# Patient Record
Sex: Male | Born: 1975 | Hispanic: No | Marital: Single | Smoking: Never smoker
Health system: Southern US, Community
[De-identification: ages and names within clinical notes are randomized; demographics above are authoritative.]

## PROBLEM LIST (undated history)

## (undated) DIAGNOSIS — Z789 Other specified health status: Secondary | ICD-10-CM

## (undated) HISTORY — DX: Other specified health status: Z78.9

## (undated) HISTORY — PX: NO PAST SURGERIES: SHX2092

---

## 2018-12-02 ENCOUNTER — Encounter: Payer: Self-pay | Admitting: Sports Medicine

## 2018-12-02 ENCOUNTER — Other Ambulatory Visit: Payer: Self-pay

## 2018-12-02 ENCOUNTER — Ambulatory Visit (INDEPENDENT_AMBULATORY_CARE_PROVIDER_SITE_OTHER): Payer: Managed Care, Other (non HMO)

## 2018-12-02 ENCOUNTER — Ambulatory Visit (INDEPENDENT_AMBULATORY_CARE_PROVIDER_SITE_OTHER): Payer: Managed Care, Other (non HMO) | Admitting: Sports Medicine

## 2018-12-02 DIAGNOSIS — R911 Solitary pulmonary nodule: Secondary | ICD-10-CM

## 2018-12-02 DIAGNOSIS — S20212A Contusion of left front wall of thorax, initial encounter: Secondary | ICD-10-CM

## 2018-12-02 DIAGNOSIS — S52515A Nondisplaced fracture of left radial styloid process, initial encounter for closed fracture: Secondary | ICD-10-CM

## 2018-12-02 DIAGNOSIS — S298XXA Other specified injuries of thorax, initial encounter: Secondary | ICD-10-CM | POA: Insufficient documentation

## 2018-12-02 NOTE — Assessment & Plan Note (Signed)
11 mm pulmonary nodule that simply appeared to be granulomatous changes. He plans to establish care with Dr. Luetta Nutting or one of our other PCPs and I would like them to follow this up.

## 2018-12-02 NOTE — Assessment & Plan Note (Signed)
Radial styloid, continue thumb spica brace, he declines cast placement. No pain medication needed, because he has some pain at the snuffbox we are going to proceed with a CT scan. Pain at the snuffbox, known radial styloid, history of scaphoid fracture, need further evaluation as to whether there is an acute scaphoid fracture
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
## 2018-12-02 NOTE — Assessment & Plan Note (Signed)
No fracture seen on CT scan. Symptomatic treatment alone.

## 2018-12-04 ENCOUNTER — Ambulatory Visit
Admission: RE | Admit: 2018-12-04 | Discharge: 2018-12-04 | Disposition: A | Discharge status: Home / Self Care | Payer: Managed Care, Other (non HMO) | Source: Ambulatory Visit | Attending: Sports Medicine | Admitting: Sports Medicine

## 2018-12-04 ENCOUNTER — Other Ambulatory Visit: Payer: Self-pay

## 2018-12-30 ENCOUNTER — Ambulatory Visit (INDEPENDENT_AMBULATORY_CARE_PROVIDER_SITE_OTHER): Payer: Managed Care, Other (non HMO) | Admitting: Sports Medicine

## 2018-12-30 ENCOUNTER — Other Ambulatory Visit: Payer: Self-pay

## 2018-12-30 ENCOUNTER — Encounter: Payer: Self-pay | Admitting: Sports Medicine

## 2018-12-30 ENCOUNTER — Ambulatory Visit (INDEPENDENT_AMBULATORY_CARE_PROVIDER_SITE_OTHER): Payer: Managed Care, Other (non HMO)

## 2018-12-30 DIAGNOSIS — S52515A Nondisplaced fracture of left radial styloid process, initial encounter for closed fracture: Secondary | ICD-10-CM | POA: Diagnosis not present

## 2018-12-30 NOTE — Assessment & Plan Note (Signed)
Nontender over the fracture, expected loss of range of motion with immobilization. Repeat x-rays today, we will continue his brace for another 2 weeks and then work on some physical therapy and range of motion.

## 2018-12-30 NOTE — Progress Notes (Signed)
Subjective:    CC: Follow-up  HPI: Radial styloid fracture: Continues to improve.  He does have some significant discomfort at his MCP of the thumb.  I reviewed the past medical history, family history, social history, surgical history, and allergies today and no changes were needed.  Please see the problem list section below in epic for further details.  Past Medical History: Past Medical History:  Diagnosis Date  . No pertinent past medical history    Past Surgical History: Past Surgical History:  Procedure Laterality Date  . NO PAST SURGERIES     Social History: Social History   Socioeconomic History  . Marital status: Single    Spouse name: Not on file  . Number of children: Not on file  . Years of education: Not on file  . Highest education level: Not on file  Occupational History  . Not on file  Social Needs  . Financial resource strain: Not on file  . Food insecurity    Worry: Not on file    Inability: Not on file  . Transportation needs    Medical: Not on file    Non-medical: Not on file  Tobacco Use  . Smoking status: Never Smoker  . Smokeless tobacco: Never Used  Substance and Sexual Activity  . Alcohol use: Not Currently  . Drug use: Never  . Sexual activity: Yes  Lifestyle  . Physical activity    Days per week: Not on file    Minutes per session: Not on file  . Stress: Not on file  Relationships  . Social Herbalist on phone: Not on file    Gets together: Not on file    Attends religious service: Not on file    Active member of club or organization: Not on file    Attends meetings of clubs or organizations: Not on file    Relationship status: Not on file  Other Topics Concern  . Not on file  Social History Narrative  . Not on file   Family History: No family history on file. Allergies: No Known Allergies Medications: See med rec.  Review of Systems: No fevers, chills, night sweats, weight loss, chest pain, or shortness of  breath.   Objective:    General: Well Developed, well nourished, and in no acute distress.  Neuro: Alert and oriented x3, extra-ocular muscles intact, sensation grossly intact.  HEENT: Normocephalic, atraumatic, pupils equal round reactive to light, neck supple, no masses, no lymphadenopathy, thyroid nonpalpable.  Skin: Warm and dry, no rashes. Cardiac: Regular rate and rhythm, no murmurs rubs or gallops, no lower extremity edema.  Respiratory: Clear to auscultation bilaterally. Not using accessory muscles, speaking in full sentences. Left wrist: Inspection normal with no visible erythema or swelling. ROM smooth and normal with good flexion and extension and ulnar/radial deviation that is symmetrical with opposite wrist. Palpation is normal over metacarpals, navicular, lunate, and TFCC; tendons without tenderness/ swelling No snuffbox tenderness. No tenderness over Canal of Guyon. Strength 5/5 in all directions without pain. Negative tinel's and phalens signs. Negative Finkelstein sign. Negative Watson's test. There is minimal tenderness over the left first MCP with loss of motion.  Impression and Recommendations:    Nondisplaced fracture of left radial styloid process, initial encounter for closed fracture Nontender over the fracture, expected loss of range of motion with immobilization. Repeat x-rays today, we will continue his brace for another 2 weeks and then work on some physical therapy and range of motion.  ___________________________________________ Gwen Her. Dianah Field, M.D., ABFM., CAQSM. Primary Care and Sports Medicine [facility]  Adjunct Professor of [place] [employer]

## 2019-01-06 ENCOUNTER — Encounter: Payer: Self-pay | Admitting: Osteopathic Medicine

## 2019-01-06 ENCOUNTER — Other Ambulatory Visit: Payer: Self-pay

## 2019-01-06 ENCOUNTER — Ambulatory Visit (INDEPENDENT_AMBULATORY_CARE_PROVIDER_SITE_OTHER): Payer: Managed Care, Other (non HMO) | Admitting: Osteopathic Medicine

## 2019-01-06 VITALS — BP 127/82 | HR 55 | Temp 97.6°F | Wt 210.0 lb

## 2019-01-06 DIAGNOSIS — Z0000 Encounter for general adult medical examination without abnormal findings: Secondary | ICD-10-CM

## 2019-01-06 DIAGNOSIS — Z833 Family history of diabetes mellitus: Secondary | ICD-10-CM | POA: Diagnosis not present

## 2019-01-06 DIAGNOSIS — Z8639 Personal history of other endocrine, nutritional and metabolic disease: Secondary | ICD-10-CM | POA: Diagnosis not present

## 2019-01-06 DIAGNOSIS — R911 Solitary pulmonary nodule: Secondary | ICD-10-CM | POA: Diagnosis not present

## 2019-01-06 DIAGNOSIS — Z111 Encounter for screening for respiratory tuberculosis: Secondary | ICD-10-CM

## 2019-01-06 NOTE — Progress Notes (Signed)
HPI: Craig Haas is a 43 y.o. male who  has a past medical history of No pertinent past medical history.  he presents to [facility], 01/06/[facility],  for chief complaint of: Annual check-up    Patient here for annual physical / wellness exam.  See preventive care reviewed as below.  Recent imaging results reviewed in detail with the patient.   Additional concerns today include:  None Trying to lose weight and get back in shape Seeing Dr T for fracture care   Past medical, surgical, social and family history reviewed:  Patient Active Problem List   Diagnosis Date Noted  . Nondisplaced fracture of left radial styloid process, initial encounter for closed fracture 12/02/2018  . Rib contusion, left, initial encounter 12/02/2018  . Pulmonary nodule 12/02/2018    Past Surgical History:  Procedure Laterality Date  . NO PAST SURGERIES      Social History   Tobacco Use  . Smoking status: Never Smoker  . Smokeless tobacco: Never Used  Substance Use Topics  . Alcohol use: Not Currently    No family history on file.   Current medication list and allergy/intolerance information reviewed:    No current outpatient medications on file.   No current facility-administered medications for this visit.    No Known Allergies    Review of Systems:  Constitutional:  No  fever, no chills, No recent illness, No unintentional weight changes. No significant fatigue.   HEENT: No  headache, no vision change, no hearing change, No sore throat, No  sinus pressure  Cardiac: No  chest pain, No  pressure, No palpitations, No  Orthopnea  Respiratory:  No  shortness of breath. No  Cough  Gastrointestinal: No  abdominal pain, No  nausea, No  vomiting,  No  blood in stool, No  diarrhea, No  constipation   Musculoskeletal: No new myalgia/arthralgia  Skin: No  Rash, No other wounds/concerning lesions  Endocrine: No cold intolerance,  No heat  intolerance. No polyuria/polydipsia/polyphagia   Neurologic: No  weakness, No  dizziness  Psychiatric: No  concerns with depression, No  concerns with anxiety, No sleep problems, No mood problems  Exam:  BP 127/82 (BP Location: Right Arm, Patient Position: Sitting, Cuff Size: Normal)   Pulse (!) 55   Temp 97.6 F (36.4 C) (Oral)   Wt 210 lb (95.3 kg)   BMI 31.01 kg/m   Constitutional: VS see above. General Appearance: alert, well-developed, well-nourished, NAD  Eyes: Normal lids and conjunctive, non-icteric sclera  Ears, Nose, Mouth, Throat: TM normal bilaterally.  Neck: No masses, trachea midline. No thyroid enlargement. No tenderness/mass appreciated. No lymphadenopathy  Respiratory: Normal respiratory effort. no wheeze, no rhonchi, no rales  Cardiovascular: S1/S2 normal, no murmur, no rub/gallop auscultated. RRR. No lower extremity edema.   Gastrointestinal: Nontender, no masses. No hepatomegaly, no splenomegaly. No hernia appreciated. Bowel sounds normal. Rectal exam deferred.   Musculoskeletal: Gait normal. No clubbing/cyanosis of digits.   Neurological: Normal balance/coordination. No tremor. No cranial nerve deficit on limited exam. Motor and sensation intact and symmetric. Cerebellar reflexes intact.   Skin: warm, dry, intact. No rash/ulcer. No concerning nevi or subq nodules on limited exam.    Psychiatric: Normal judgment/insight. Normal mood and affect. Oriented x3.    No results found for this or any previous visit (from the past 72 hour(s)).  No results found.   ASSESSMENT/PLAN: The primary encounter diagnosis was Annual physical exam. Diagnoses of Pulmonary nodule, History of high  cholesterol, Family history of diabetes mellitus, and Screening-pulmonary TB were also pertinent to this visit.  Patient was advised to bring disc with his imaging on it, would probably send this to one of our radiologists for a second opinion regarding follow-up, no follow-up was  mentioned in initial report.  Patient is at low risk for TB, does not work in a healthcare setting, no travel to developing countries, no night sweats/weight loss, no cough.  Personal reminder set in epic to circle back on this in 2 weeks  Immunization History  Administered Date(s) Administered  . Tdap 06/24/2014    Orders Placed This Encounter  Procedures  . CBC  . COMPLETE METABOLIC PANEL WITH GFR  . LIPID SCREENING  . TSH  . QuantiFERON-TB Gold Plus       Patient Instructions  General Preventive Care  Most recent routine screening lipids/other labs: ordered today   Everyone should have blood pressure checked once per year.   Tobacco: don't!   Alcohol: responsible moderation is ok for most adults - if you have concerns about your alcohol intake, please talk to me!   Exercise: as tolerated to reduce risk of cardiovascular disease and diabetes. Strength training will also prevent osteoporosis.   Mental health: if need for mental health care (medicines, counseling, other), or concerns about moods, please let me know!   Sexual health: if need for STD testing, or if concerns with libido/pain problems, please let me know!   Advanced Directive: Living Will and/or Healthcare Power of Attorney recommended for all adults, regardless of age or health.  Vaccines  Flu vaccine: recommended for almost everyone, every fall.   Shingles vaccine: Shingrix recommended after age 48.   Pneumonia vaccines: Prevnar and Pneumovax recommended after age 74.  Tetanus booster: Tdap recommended every 10 years. Due 2026.  Cancer screenings   Colon cancer screening: recommended for everyone at age 63-50  Prostate cancer screening: PSA blood test around age 59  Lung cancer screening: not needed for non-smokers Infection screenings . HIV: recommended screening at least once age 84-65, more often as needed. . Gonorrhea/Chlamydia: screening as needed . Hepatitis C: recommended for anyone born  08-1961 . TB: certain at-risk populations, or depending on work requirements and/or travel history - might consider, given your CT results Other . Bone Density Test: recommended for men at age 3, sooner depending on risk factors . Abdominal Aortic Aneurysm: screening with ultrasound recommended once for men age 41-75 who have ever smoked        Visit summary with medication list and pertinent instructions was printed for patient to review. All questions at time of visit were answered - patient instructed to contact office with any additional concerns or updates. ER/RTC precautions were reviewed with the patient.      Please note: voice recognition software was used to produce this document, and typos may escape review. Please contact Dr. Lyn Hollingshead for any needed clarifications.     Follow-up plan: Return in about 1 year (around 01/06/2020) for [place] (call week prior to visit for lab orders).

## 2019-01-06 NOTE — Patient Instructions (Addendum)
General Preventive Care  Most recent routine screening lipids/other labs: ordered today   Everyone should have blood pressure checked once per year.   Tobacco: don't!   Alcohol: responsible moderation is ok for most adults - if you have concerns about your alcohol intake, please talk to me!   Exercise: as tolerated to reduce risk of cardiovascular disease and diabetes. Strength training will also prevent osteoporosis.   Mental health: if need for mental health care (medicines, counseling, other), or concerns about moods, please let me know!   Sexual health: if need for STD testing, or if concerns with libido/pain problems, please let me know!   Advanced Directive: Living Will and/or Healthcare Power of Attorney recommended for all adults, regardless of age or health.  Vaccines  Flu vaccine: recommended for almost everyone, every fall.   Shingles vaccine: Shingrix recommended after age 38.   Pneumonia vaccines: Prevnar and Pneumovax recommended after age 67.  Tetanus booster: Tdap recommended every 10 years. Due 2026.  Cancer screenings   Colon cancer screening: recommended for everyone at age 1-50  Prostate cancer screening: PSA blood test around age 24  Lung cancer screening: not needed for non-smokers Infection screenings . HIV: recommended screening at least once age 21-65, more often as needed. . Gonorrhea/Chlamydia: screening as needed . Hepatitis C: recommended for anyone born 65-1965 . TB: certain at-risk populations, or depending on work requirements and/or travel history - might consider, given your CT results Other . Bone Density Test: recommended for men at age 77, sooner depending on risk factors . Abdominal Aortic Aneurysm: screening with ultrasound recommended once for men age 65-75 who have ever smoked

## 2019-01-07 ENCOUNTER — Encounter: Payer: Self-pay | Admitting: Osteopathic Medicine

## 2019-01-09 LAB — LIPID PANEL
Cholesterol: 195 mg/dL (ref <200)
HDL: 33 mg/dL — ABNORMAL LOW (ref >=40)
LDL Cholesterol (Calc): 138 mg/dL (calc) — ABNORMAL HIGH
Non-HDL Cholesterol (Calc): 162 mg/dL (calc) — ABNORMAL HIGH (ref <130)
Total CHOL/HDL Ratio: 5.9 (calc) — ABNORMAL HIGH (ref <5.0)
Triglycerides: 121 mg/dL (ref <150)

## 2019-01-09 LAB — COMPLETE METABOLIC PANEL WITH GFR
AG Ratio: 1.5 (calc) (ref 1.0–2.5)
ALT: 52 U/L — ABNORMAL HIGH (ref 9–46)
AST: 27 U/L (ref 10–40)
Albumin: 4.4 g/dL (ref 3.6–5.1)
Alkaline phosphatase (APISO): 81 U/L (ref 36–130)
BUN: 19 mg/dL (ref 7–25)
CO2: 28 mmol/L (ref 20–32)
Calcium: 9.3 mg/dL (ref 8.6–10.3)
Chloride: 106 mmol/L (ref 98–110)
Creat: 1.02 mg/dL (ref 0.60–1.35)
GFR, Est African American: 105 mL/min/{1.73_m2} (ref >=60)
GFR, Est Non African American: 91 mL/min/{1.73_m2} (ref >=60)
Globulin: 2.9 g/dL (calc) (ref 1.9–3.7)
Glucose, Bld: 105 mg/dL — ABNORMAL HIGH (ref 65–99)
Potassium: 4.5 mmol/L (ref 3.5–5.3)
Sodium: 141 mmol/L (ref 135–146)
Total Bilirubin: 1 mg/dL (ref 0.2–1.2)
Total Protein: 7.3 g/dL (ref 6.1–8.1)

## 2019-01-09 LAB — CBC
HCT: 43.6 % (ref 38.5–50.0)
Hemoglobin: 15 g/dL (ref 13.2–17.1)
MCH: 31.9 pg (ref 27.0–33.0)
MCHC: 34.4 g/dL (ref 32.0–36.0)
MCV: 92.8 fL (ref 80.0–100.0)
MPV: 11.7 fL (ref 7.5–12.5)
Platelets: 260 10*3/uL (ref 140–400)
RBC: 4.7 10*6/uL (ref 4.20–5.80)
RDW: 12.7 % (ref 11.0–15.0)
WBC: 5.5 10*3/uL (ref 3.8–10.8)

## 2019-01-09 LAB — TSH: TSH: 1.49 mIU/L (ref 0.40–4.50)

## 2019-01-09 LAB — HEMOGLOBIN A1C W/OUT EAG: Hgb A1c MFr Bld: 5.2 % of total Hgb (ref <5.7)

## 2019-01-09 LAB — QUANTIFERON-TB GOLD PLUS
Mitogen-NIL: >10 IU/mL
NIL: 0.03 IU/mL
QuantiFERON-TB Gold Plus: NEGATIVE
TB1-NIL: 0 IU/mL
TB2-NIL: 0.02 IU/mL

## 2019-01-22 ENCOUNTER — Telehealth: Payer: Self-pay | Admitting: Osteopathic Medicine

## 2019-01-22 ENCOUNTER — Encounter: Payer: Self-pay | Admitting: Osteopathic Medicine

## 2019-01-22 NOTE — Telephone Encounter (Addendum)
See MyChart message

## 2019-01-27 ENCOUNTER — Ambulatory Visit (INDEPENDENT_AMBULATORY_CARE_PROVIDER_SITE_OTHER): Payer: Managed Care, Other (non HMO)

## 2019-01-27 ENCOUNTER — Encounter: Payer: Self-pay | Admitting: Sports Medicine

## 2019-01-27 ENCOUNTER — Ambulatory Visit (INDEPENDENT_AMBULATORY_CARE_PROVIDER_SITE_OTHER): Payer: Managed Care, Other (non HMO) | Admitting: Sports Medicine

## 2019-01-27 ENCOUNTER — Other Ambulatory Visit: Payer: Self-pay

## 2019-01-27 DIAGNOSIS — M79642 Pain in left hand: Secondary | ICD-10-CM | POA: Diagnosis not present

## 2019-01-27 DIAGNOSIS — S52515A Nondisplaced fracture of left radial styloid process, initial encounter for closed fracture: Secondary | ICD-10-CM

## 2019-01-27 NOTE — Assessment & Plan Note (Addendum)
Undiagnosed new pain in the left hand, over the fifth metacarpal as well as the ulnar-sided carpals. Reproduction of pain with squeezing the metacarpals together. Unclear etiology, x-rays today, we do need to focus on the pisiform and the triquetrum. We did do a CT scan of his wrist at the initial injury that did not show any injury and his fifth metacarpal or carpals. We will watch this for an additional 2 weeks and if persistent discomfort we will proceed with MRI of the hand and wrist.
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
## 2019-02-10 ENCOUNTER — Other Ambulatory Visit: Payer: Self-pay

## 2019-02-10 ENCOUNTER — Ambulatory Visit (INDEPENDENT_AMBULATORY_CARE_PROVIDER_SITE_OTHER): Payer: Managed Care, Other (non HMO) | Admitting: Sports Medicine

## 2019-02-10 DIAGNOSIS — S52515A Nondisplaced fracture of left radial styloid process, initial encounter for closed fracture: Secondary | ICD-10-CM | POA: Diagnosis not present
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
## 2020-01-12 ENCOUNTER — Encounter: Payer: Managed Care, Other (non HMO) | Admitting: Osteopathic Medicine

## 2020-01-12 ENCOUNTER — Telehealth: Payer: Self-pay | Admitting: General Practice
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
## 2020-01-12 NOTE — Telephone Encounter (Signed)
OK to remove from the schedule

## 2020-02-17 ENCOUNTER — Ambulatory Visit (INDEPENDENT_AMBULATORY_CARE_PROVIDER_SITE_OTHER): Payer: 59 | Admitting: Osteopathic Medicine

## 2020-02-17 ENCOUNTER — Encounter: Payer: Self-pay | Admitting: Osteopathic Medicine

## 2020-02-17 ENCOUNTER — Other Ambulatory Visit: Payer: Self-pay

## 2020-02-17 VITALS — BP 119/85 | HR 59 | Wt 209.1 lb

## 2020-02-17 DIAGNOSIS — Z833 Family history of diabetes mellitus: Secondary | ICD-10-CM | POA: Diagnosis not present

## 2020-02-17 DIAGNOSIS — Z0000 Encounter for general adult medical examination without abnormal findings: Secondary | ICD-10-CM | POA: Diagnosis not present

## 2020-02-17 DIAGNOSIS — Z8639 Personal history of other endocrine, nutritional and metabolic disease: Secondary | ICD-10-CM

## 2020-02-17 DIAGNOSIS — M778 Other enthesopathies, not elsewhere classified: Secondary | ICD-10-CM

## 2020-02-17 DIAGNOSIS — R7989 Other specified abnormal findings of blood chemistry: Secondary | ICD-10-CM

## 2020-02-17 NOTE — Patient Instructions (Addendum)
General Preventive Care  Most recent routine screening lipids/other labs: ordered today   Everyone should have blood pressure checked once per year.   Tobacco: don't!   Alcohol: responsible moderation is ok for most adults - if you have concerns about your alcohol intake, please talk to me!   Exercise: as tolerated to reduce risk of cardiovascular disease and diabetes. Strength training will also prevent osteoporosis.   Mental health: if need for mental health care (medicines, counseling, other), or concerns about moods, please let me know!   Sexual health: if need for STD testing, or if concerns with libido/pain problems, please let me know!   Advanced Directive: Living Will and/or Healthcare Power of Attorney recommended for all adults, regardless of age or health.  Vaccines  Flu vaccine: recommended every fall.   Shingles vaccine: recommended after age 73.   Pneumonia vaccines: recommended after age 65.  Tetanus booster: Tdap recommended every 10 years. Due 2026.   COVID vaccine: STRONGLY RECOMMENDED  Cancer screenings   Colon cancer screening: recommended for everyone at age 58-75  Prostate cancer screening: PSA blood test age 41  Lung cancer screening: not needed for non-smokers Infection screenings  HIV: recommended screening at least once age 65-65  Gonorrhea/Chlamydia: screening as needed  Hepatitis C: recommended for anyone born 70-1963  TB: certain at-risk populations, or depending on work requirements and/or travel history - might consider, given your CT results Other  Bone Density Test: recommended for men at age 51                Please note: Preventive care issues were addressed today as part of your annual wellness physical, and this care should be covered under your insurance. However there were other medical issues which were also addressed today, and insurance may bill you separately for a "problem-based visit" for this care: shoulder  pain. Any questions or concerns about charges which may appear on your billing statement should be directed to your insurance company or to [facility] billing department, and they will contact our office if there are further concerns.
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
## 2020-02-20 ENCOUNTER — Encounter: Payer: Self-pay | Admitting: Osteopathic Medicine

## 2020-02-20 ENCOUNTER — Telehealth: Payer: Self-pay

## 2020-02-20 DIAGNOSIS — Z0000 Encounter for general adult medical examination without abnormal findings: Secondary | ICD-10-CM

## 2020-02-20 DIAGNOSIS — Z113 Encounter for screening for infections with a predominantly sexual mode of transmission: Secondary | ICD-10-CM

## 2020-02-20 NOTE — Telephone Encounter (Signed)
Pt requested to have HIV testing added to previous lab order on 02/18/20. Task completed. Req #: [id number].

## 2020-02-23 ENCOUNTER — Ambulatory Visit
Admission: RE | Admit: 2020-02-23 | Discharge: 2020-02-23 | Disposition: A | Discharge status: Home / Self Care | Payer: 59 | Source: Ambulatory Visit | Attending: Osteopathic Medicine | Admitting: Osteopathic Medicine

## 2020-02-23 ENCOUNTER — Other Ambulatory Visit: Payer: Self-pay

## 2020-02-23 DIAGNOSIS — M778 Other enthesopathies, not elsewhere classified: Secondary | ICD-10-CM

## 2020-02-23 LAB — HEMOGLOBIN A1C
Hgb A1c MFr Bld: 5.5 % of total Hgb (ref <5.7)
Mean Plasma Glucose: 111 mg/dL
eAG (mmol/L): 6.2 mmol/L

## 2020-02-23 LAB — COMPLETE METABOLIC PANEL WITH GFR
AG Ratio: 1.6 (calc) (ref 1.0–2.5)
ALT: 30 U/L (ref 9–46)
AST: 18 U/L (ref 10–40)
Albumin: 4.4 g/dL (ref 3.6–5.1)
Alkaline phosphatase (APISO): 87 U/L (ref 36–130)
BUN: 17 mg/dL (ref 7–25)
CO2: 31 mmol/L (ref 20–32)
Calcium: 9.2 mg/dL (ref 8.6–10.3)
Chloride: 106 mmol/L (ref 98–110)
Creat: 1.13 mg/dL (ref 0.60–1.35)
GFR, Est African American: 92 mL/min/{1.73_m2} (ref >=60)
GFR, Est Non African American: 80 mL/min/{1.73_m2} (ref >=60)
Globulin: 2.7 g/dL (calc) (ref 1.9–3.7)
Glucose, Bld: 101 mg/dL — ABNORMAL HIGH (ref 65–99)
Potassium: 4.5 mmol/L (ref 3.5–5.3)
Sodium: 142 mmol/L (ref 135–146)
Total Bilirubin: 0.8 mg/dL (ref 0.2–1.2)
Total Protein: 7.1 g/dL (ref 6.1–8.1)

## 2020-02-23 LAB — CBC
HCT: 41.5 % (ref 38.5–50.0)
Hemoglobin: 14.5 g/dL (ref 13.2–17.1)
MCH: 32 pg (ref 27.0–33.0)
MCHC: 34.9 g/dL (ref 32.0–36.0)
MCV: 91.6 fL (ref 80.0–100.0)
MPV: 11.9 fL (ref 7.5–12.5)
Platelets: 276 10*3/uL (ref 140–400)
RBC: 4.53 10*6/uL (ref 4.20–5.80)
RDW: 12.5 % (ref 11.0–15.0)
WBC: 6.9 10*3/uL (ref 3.8–10.8)

## 2020-02-23 LAB — LIPID PANEL
Cholesterol: 193 mg/dL (ref <200)
HDL: 30 mg/dL — ABNORMAL LOW (ref >=40)
LDL Cholesterol (Calc): 136 mg/dL (calc) — ABNORMAL HIGH
Non-HDL Cholesterol (Calc): 163 mg/dL (calc) — ABNORMAL HIGH (ref <130)
Total CHOL/HDL Ratio: 6.4 (calc) — ABNORMAL HIGH (ref <5.0)
Triglycerides: 141 mg/dL (ref <150)

## 2020-02-23 LAB — TESTOSTERONE: Testosterone: 327 ng/dL (ref 250–827)

## 2020-02-23 LAB — HIV ANTIBODY (ROUTINE TESTING W REFLEX)

## 2020-02-23 LAB — TSH: TSH: 2.47 mIU/L (ref 0.40–4.50)

## 2020-02-24 NOTE — Addendum Note (Signed)
Addended by: Deirdre Pippins on: 02/24/2020 05:10 PM   Modules accepted: Orders

## 2020-03-01 LAB — HIV ANTIBODY (ROUTINE TESTING W REFLEX): HIV 1&2 Ab, 4th Generation: NONREACTIVE

## 2020-12-21 ENCOUNTER — Other Ambulatory Visit: Payer: Self-pay | Admitting: Orthopedic Surgery

## 2020-12-21 DIAGNOSIS — M25511 Pain in right shoulder: Secondary | ICD-10-CM

## 2020-12-21 DIAGNOSIS — M25521 Pain in right elbow: Secondary | ICD-10-CM
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
## 2021-06-16 ENCOUNTER — Ambulatory Visit (INDEPENDENT_AMBULATORY_CARE_PROVIDER_SITE_OTHER): Payer: Managed Care, Other (non HMO) | Admitting: Family Medicine

## 2021-06-16 ENCOUNTER — Encounter: Payer: Self-pay | Admitting: Family Medicine

## 2021-06-16 VITALS — BP 120/68 | HR 68 | Ht 69.0 in | Wt 183.0 lb

## 2021-06-16 DIAGNOSIS — Z13220 Encounter for screening for lipoid disorders: Secondary | ICD-10-CM

## 2021-06-16 DIAGNOSIS — Z0000 Encounter for general adult medical examination without abnormal findings: Secondary | ICD-10-CM | POA: Diagnosis not present

## 2021-06-16 DIAGNOSIS — R5383 Other fatigue: Secondary | ICD-10-CM | POA: Diagnosis not present

## 2021-06-16 NOTE — Assessment & Plan Note (Signed)
Well adult ?Orders Placed This Encounter  ?Procedures  ?? COMPLETE METABOLIC PANEL WITH GFR  ?? CBC with Differential  ?? Lipid Panel w/reflex Direct LDL  ?? TSH  ?? Testosterone  ?Screenings: Per lab orders ?Immunizations: Up-to-date ?Anticipatory guidance/risk factor reduction: Recommendations per AVS. ?

## 2021-06-16 NOTE — Progress Notes (Signed)
Craig Haas - 46 y.o. male MRN [id number]  Date of birth: 06/28/1975  Subjective Chief Complaint  Patient presents with   Annual Exam    HPI Craig Haas is a 46 y.o. male here today for annual exam.  He reports that he is doing fairly well at this time.  He has had some fatigue and doesn't feel like he heals as quickly as he once did.  He is concerned that testosterone levels may be low.    He does try to continue to stay pretty active.  Has recently had setback due to shoulder surgery.  Does feel like his diet is pretty good.  He is a non-smoker.  Denies alcohol use at this time.  Review of Systems  Constitutional:  Negative for chills, fever, malaise/fatigue and weight loss.  HENT:  Negative for congestion, ear pain and sore throat.   Eyes:  Negative for blurred vision, double vision and pain.  Respiratory:  Negative for cough and shortness of breath.   Cardiovascular:  Negative for chest pain and palpitations.  Gastrointestinal:  Negative for abdominal pain, blood in stool, constipation, heartburn and nausea.  Genitourinary:  Negative for dysuria and urgency.  Musculoskeletal:  Negative for joint pain and myalgias.  Neurological:  Negative for dizziness and headaches.  Endo/Heme/Allergies:  Does not bruise/bleed easily.  Psychiatric/Behavioral:  Negative for depression. The patient is not nervous/anxious and does not have insomnia.      No Known Allergies  Past Medical History:  Diagnosis Date   No pertinent past medical history     Past Surgical History:  Procedure Laterality Date   NO PAST SURGERIES      Social History   Socioeconomic History   Marital status: Single    Spouse name: Not on file   Number of children: Not on file   Years of education: Not on file   Highest education level: Not on file  Occupational History   Not on file  Tobacco Use   Smoking status: Never   Smokeless tobacco: Never  Substance and Sexual Activity   Alcohol use: Not  Currently   Drug use: Never   Sexual activity: Yes  Other Topics Concern   Not on file  Social History Narrative   Not on file   Social Determinants of Health   Financial Resource Strain: Not on file  Food Insecurity: Not on file  Transportation Needs: Not on file  Physical Activity: Not on file  Stress: Not on file  Social Connections: Not on file    History reviewed. No pertinent family history.  Health Maintenance  Topic Date Due   COVID-19 Vaccine (1) 09/23/2021 (Originally 05/22/1976)   Hepatitis C Screening  06/17/2022 (Originally 11/21/1995)   INFLUENZA VACCINE  08/23/2021   TETANUS/TDAP  06/23/2024   HIV Screening  Completed   HPV VACCINES  Aged Out     ----------------------------------------------------------------------------------------------------------------------------------------------------------------------------------------------------------------- Physical Exam BP 120/68 (BP Location: Left Arm, Patient Position: Sitting, Cuff Size: Normal)   Pulse 68   Ht 5\' 9"  (1.753 m)   Wt 183 lb (83 kg)   SpO2 99%   BMI 27.02 kg/m   Physical Exam Constitutional:      General: He is not in acute distress. HENT:     Head: Normocephalic and atraumatic.     Right Ear: Tympanic membrane and external ear normal.     Left Ear: Tympanic membrane and external ear normal.  Eyes:     General: No scleral icterus. Neck:  Thyroid: No thyromegaly.  Cardiovascular:     Rate and Rhythm: Normal rate and regular rhythm.     Heart sounds: Normal heart sounds.  Pulmonary:     Effort: Pulmonary effort is normal.     Breath sounds: Normal breath sounds.  Abdominal:     General: Bowel sounds are normal. There is no distension.     Palpations: Abdomen is soft.     Tenderness: There is no abdominal tenderness. There is no guarding.  Musculoskeletal:     Cervical back: Normal range of motion.  Lymphadenopathy:     Cervical: No cervical adenopathy.  Skin:    General:  Skin is warm and dry.     Findings: No rash.  Neurological:     Mental Status: He is alert and oriented to person, place, and time.     Cranial Nerves: No cranial nerve deficit.     Motor: No abnormal muscle tone.  Psychiatric:        Mood and Affect: Mood normal.        Behavior: Behavior normal.    ------------------------------------------------------------------------------------------------------------------------------------------------------------------------------------------------------------------- Assessment and Plan  Well adult exam Well adult Orders Placed This Encounter  Procedures   COMPLETE METABOLIC PANEL WITH GFR   CBC with Differential   Lipid Panel w/reflex Direct LDL   TSH   Testosterone  Screenings: Per lab orders Immunizations: Up-to-date Anticipatory guidance/risk factor reduction: Recommendations per AVS.   No orders of the defined types were placed in this encounter.   No follow-ups on file.    This visit occurred during the SARS-CoV-2 public health emergency.  Safety protocols were in place, including screening questions prior to the visit, additional usage of staff PPE, and extensive cleaning of exam room while observing appropriate contact time as indicated for disinfecting solutions.

## 2021-06-16 NOTE — Patient Instructions (Signed)

## 2021-06-17 LAB — LIPID PANEL W/REFLEX DIRECT LDL
Cholesterol: 173 mg/dL (ref <200)
HDL: 39 mg/dL — ABNORMAL LOW (ref >=40)
LDL Cholesterol (Calc): 113 mg/dL (calc) — ABNORMAL HIGH
Non-HDL Cholesterol (Calc): 134 mg/dL (calc) — ABNORMAL HIGH (ref <130)
Total CHOL/HDL Ratio: 4.4 (calc) (ref <5.0)
Triglycerides: 103 mg/dL (ref <150)

## 2021-06-17 LAB — TSH: TSH: 1.58 mIU/L (ref 0.40–4.50)

## 2021-06-17 LAB — COMPLETE METABOLIC PANEL WITH GFR
AG Ratio: 1.7 (calc) (ref 1.0–2.5)
ALT: 26 U/L (ref 9–46)
AST: 25 U/L (ref 10–40)
Albumin: 4.3 g/dL (ref 3.6–5.1)
Alkaline phosphatase (APISO): 84 U/L (ref 36–130)
BUN: 15 mg/dL (ref 7–25)
CO2: 27 mmol/L (ref 20–32)
Calcium: 9.3 mg/dL (ref 8.6–10.3)
Chloride: 104 mmol/L (ref 98–110)
Creat: 1.07 mg/dL (ref 0.60–1.29)
Globulin: 2.5 g/dL (calc) (ref 1.9–3.7)
Glucose, Bld: 87 mg/dL (ref 65–99)
Potassium: 4 mmol/L (ref 3.5–5.3)
Sodium: 140 mmol/L (ref 135–146)
Total Bilirubin: 1.4 mg/dL — ABNORMAL HIGH (ref 0.2–1.2)
Total Protein: 6.8 g/dL (ref 6.1–8.1)
eGFR: 88 mL/min/{1.73_m2} (ref >=60)

## 2021-06-17 LAB — CBC WITH DIFFERENTIAL/PLATELET
Absolute Monocytes: 502 cells/uL (ref 200–950)
Basophils Absolute: 41 cells/uL (ref 0–200)
Basophils Relative: 0.7 %
Eosinophils Absolute: 30 cells/uL (ref 15–500)
Eosinophils Relative: 0.5 %
HCT: 42.1 % (ref 38.5–50.0)
Hemoglobin: 14.3 g/dL (ref 13.2–17.1)
Lymphs Abs: 1906 cells/uL (ref 850–3900)
MCH: 31.6 pg (ref 27.0–33.0)
MCHC: 34 g/dL (ref 32.0–36.0)
MCV: 92.9 fL (ref 80.0–100.0)
MPV: 11.6 fL (ref 7.5–12.5)
Monocytes Relative: 8.5 %
Neutro Abs: 3422 cells/uL (ref 1500–7800)
Neutrophils Relative %: 58 %
Platelets: 254 10*3/uL (ref 140–400)
RBC: 4.53 10*6/uL (ref 4.20–5.80)
RDW: 12.3 % (ref 11.0–15.0)
Total Lymphocyte: 32.3 %
WBC: 5.9 10*3/uL (ref 3.8–10.8)

## 2021-06-17 LAB — TESTOSTERONE: Testosterone: 502 ng/dL (ref 250–827)

## 2022-08-23 ENCOUNTER — Telehealth: Payer: Self-pay | Admitting: General Practice

## 2022-08-23 ENCOUNTER — Telehealth: Payer: Self-pay | Admitting: Family Medicine

## 2022-08-23 NOTE — Transitions of Care (Post Inpatient/ED Visit) (Signed)
   08/23/2022  Name: Craig Haas MRN: [id number] DOB: March 15, 1975  Today's TOC FU Call Status: Today's TOC FU Call Status:: Successful TOC FU Call Competed TOC FU Call Complete Date: 08/23/22  Transition Care Management Follow-up Telephone Call Date of Discharge: 08/22/22 Discharge Facility: Other (Non-[facility] Facility) Name of Other ([facility]) Discharge Facility: [facility] Type of Discharge: Emergency Department Reason for ED Visit: Other: (not listed)  Items Reviewed:   Called patient to complete transition of care, patient stated that he did not go to the ER and he has moved to [place] and has found a new PCP there. removed Dr. Ashley Royalty as PCP per patient request.   Beatrix Fetters, RN BSN Nurse Health Advisor

## 2022-08-23 NOTE — Telephone Encounter (Signed)
Patient called in to follow up on [facility] and states that he was never seen at [facility] in [place]. He wants to know how the paperwork was sent in and who sent it. Also, Amy from [facility] called as well regarding the same thing. Please advise, patient is requesting call back.

## 2022-08-28 NOTE — Telephone Encounter (Signed)
Returned patient's phone and left message.   No phone number was provided for Amy from [employer].
